# Patient Record
Sex: Female | Born: 1980
Health system: Southern US, Community
[De-identification: ages and names within clinical notes are randomized; demographics above are authoritative.]

## PROBLEM LIST (undated history)

## (undated) DIAGNOSIS — N63 Unspecified lump in unspecified breast: Secondary | ICD-10-CM

## (undated) DIAGNOSIS — F988 Other specified behavioral and emotional disorders with onset usually occurring in childhood and adolescence: Secondary | ICD-10-CM

## (undated) HISTORY — PX: OTHER SURGICAL HISTORY: SHX169

## (undated) HISTORY — PX: REPAIR ANKLE LIGAMENT: SUR1187

---

## 1999-05-19 ENCOUNTER — Other Ambulatory Visit: Admission: RE | Admit: 1999-05-19 | Discharge: 1999-05-19 | Payer: Self-pay

## 2000-05-18 ENCOUNTER — Other Ambulatory Visit: Admission: RE | Admit: 2000-05-18 | Discharge: 2000-05-18 | Payer: Self-pay | Admitting: Obstetrics & Gynecology

## 2001-07-05 ENCOUNTER — Other Ambulatory Visit: Admission: RE | Admit: 2001-07-05 | Discharge: 2001-07-05 | Payer: Self-pay | Admitting: Obstetrics & Gynecology

## 2002-08-02 ENCOUNTER — Other Ambulatory Visit: Admission: RE | Admit: 2002-08-02 | Discharge: 2002-08-02 | Payer: Self-pay | Admitting: Obstetrics & Gynecology

## 2003-06-02 ENCOUNTER — Inpatient Hospital Stay (HOSPITAL_COMMUNITY): Admission: AD | Admit: 2003-06-02 | Discharge: 2003-06-02 | Payer: Self-pay | Admitting: Obstetrics & Gynecology

## 2003-06-26 ENCOUNTER — Inpatient Hospital Stay (HOSPITAL_COMMUNITY): Admission: AD | Admit: 2003-06-26 | Discharge: 2003-06-29 | Payer: Self-pay | Admitting: Obstetrics & Gynecology

## 2003-06-30 ENCOUNTER — Encounter: Admission: RE | Admit: 2003-06-30 | Discharge: 2003-07-30 | Payer: Self-pay | Admitting: Obstetrics & Gynecology

## 2003-08-05 ENCOUNTER — Other Ambulatory Visit: Admission: RE | Admit: 2003-08-05 | Discharge: 2003-08-05 | Payer: Self-pay | Admitting: Obstetrics & Gynecology

## 2004-07-29 ENCOUNTER — Ambulatory Visit: Payer: Self-pay | Admitting: Family Medicine

## 2004-09-09 ENCOUNTER — Other Ambulatory Visit: Admission: RE | Admit: 2004-09-09 | Discharge: 2004-09-09 | Payer: Self-pay | Admitting: Obstetrics and Gynecology

## 2004-10-21 ENCOUNTER — Ambulatory Visit: Payer: Self-pay | Admitting: *Deleted

## 2004-10-28 ENCOUNTER — Ambulatory Visit: Payer: Self-pay | Admitting: *Deleted

## 2004-11-25 ENCOUNTER — Ambulatory Visit: Payer: Self-pay | Admitting: *Deleted

## 2005-02-03 ENCOUNTER — Other Ambulatory Visit: Admission: RE | Admit: 2005-02-03 | Discharge: 2005-02-03 | Payer: Self-pay | Admitting: Obstetrics and Gynecology

## 2005-04-09 ENCOUNTER — Ambulatory Visit: Payer: Self-pay | Admitting: Family Medicine

## 2005-05-21 ENCOUNTER — Ambulatory Visit: Payer: Self-pay | Admitting: Family Medicine

## 2006-01-31 ENCOUNTER — Encounter: Admission: RE | Admit: 2006-01-31 | Discharge: 2006-01-31 | Payer: Self-pay | Admitting: Obstetrics and Gynecology

## 2006-06-03 ENCOUNTER — Ambulatory Visit (HOSPITAL_COMMUNITY): Admission: RE | Admit: 2006-06-03 | Discharge: 2006-06-03 | Payer: Self-pay | Admitting: Obstetrics and Gynecology

## 2006-08-01 ENCOUNTER — Inpatient Hospital Stay (HOSPITAL_COMMUNITY): Admission: AD | Admit: 2006-08-01 | Discharge: 2006-08-01 | Payer: Self-pay | Admitting: Obstetrics and Gynecology

## 2006-11-08 ENCOUNTER — Ambulatory Visit (HOSPITAL_COMMUNITY): Admission: RE | Admit: 2006-11-08 | Discharge: 2006-11-09 | Payer: Self-pay | Admitting: Neurosurgery

## 2007-06-09 ENCOUNTER — Ambulatory Visit: Payer: Self-pay | Admitting: Cardiology

## 2007-08-31 ENCOUNTER — Ambulatory Visit: Payer: Self-pay | Admitting: Cardiology

## 2007-11-15 ENCOUNTER — Inpatient Hospital Stay (HOSPITAL_COMMUNITY): Admission: AD | Admit: 2007-11-15 | Discharge: 2007-11-15 | Payer: Self-pay | Admitting: Obstetrics and Gynecology

## 2007-12-19 ENCOUNTER — Inpatient Hospital Stay (HOSPITAL_COMMUNITY): Admission: AD | Admit: 2007-12-19 | Discharge: 2007-12-19 | Payer: Self-pay | Admitting: Obstetrics and Gynecology

## 2007-12-27 ENCOUNTER — Inpatient Hospital Stay (HOSPITAL_COMMUNITY): Admission: RE | Admit: 2007-12-27 | Discharge: 2007-12-29 | Payer: Self-pay | Admitting: Obstetrics and Gynecology

## 2008-01-23 ENCOUNTER — Ambulatory Visit: Admission: RE | Admit: 2008-01-23 | Discharge: 2008-01-23 | Payer: Self-pay | Admitting: Obstetrics and Gynecology

## 2009-02-11 ENCOUNTER — Encounter (INDEPENDENT_AMBULATORY_CARE_PROVIDER_SITE_OTHER): Payer: Self-pay | Admitting: *Deleted

## 2010-11-03 NOTE — Assessment & Plan Note (Signed)
[facility]                            CARDIOLOGY OFFICE NOTE   NAME:Oliveri, TYSHAY ADEE                     MRN:          [id number]  DATE:08/31/2007                            DOB:          29-Nov-1980    Ms. Buckwalter returns today for further management of her history of  premature atrial contractions, history of sinus tachycardia and PVCs  which were symptomatic.   She is now [redacted] weeks pregnant.  She has history of a diagnosis of mitral  valve prolapse, but her last 2D echocardiogram in 2005 showed no  significant prolapse.   We placed her on labetalol 100 mg p.r.n. for palpitations.  She has had  to take this a couple times a week and unfortunately it drops her blood  pressure and she gets lightheaded.  She has had her pressure drop as low  as 90.  She really would like to take atenolol.  I have again run this  by Dr. Shelby Dubin, who says this is a class D precaution in pregnancy  even third trimester.   Her blood pressure today is 122/70, her pulse is 96 and regular.  She  weighs 221.  The rest her exam is unchanged.   I had a long talk with Ms. Winther today.  We will try to go with 50 mg  of labetalol and if that does not work, she can take another 50 about 30  minutes later.  Hopefully this will attenuate any hypotension.  I have  also told her that if she gets lightheaded or if her blood pressure  drops, to lay down and lay on her left side.   She is going to deliver December 23, 2007, so hopefully this will tide her  over until that time.  Again, we do not feel safe using atenolol or  Toprol at this time.     Thomas C. Daleen Squibb, MD, [facility]  Electronically Signed    TCW/MedQ  DD: 08/31/2007  DT: 09/01/2007  Job #: [id number]   cc:   Elease Hashimoto A. Benedetto Goad, M.D.

## 2010-11-03 NOTE — Assessment & Plan Note (Signed)
[facility]                            CARDIOLOGY OFFICE NOTE   NAME:Joanne Fletcher, Joanne Fletcher ROUTE                     MRN:          [id number]  DATE:06/09/2007                            DOB:          [birth year]    I was asked by Dr. Benedetto Goad to evaluate Joanne Fletcher, a delightful 30-  year-old married white female who is about [redacted] weeks pregnant.   She has a history of tachy palpitations which were made worse with her  first pregnancy.  Even after pregnancy, she has continued to have them.  She has been evaluated by E. Graceann Congress, MD, in our office, in 2006.  A monitor demonstrated PACs, sinus tachycardia sometimes to a rate of  140 beats per minute and questionable premature ventricular contractions  or PACs with aberrancy.  They responded beautifully to Toprol XL 25 mg a  day.  She also was told to decrease her caffeine which also seemed to  help.   She has begun having palpitations again. They are spontaneous and  sometimes are very brief, sometimes can be more prolonged and more  aggravating.  She denies any syncope or presyncope.   There was a diagnosis of mitral valve prolapse in the past but a 2-D  echocardiogram  at [facility] dated January 29, 2004, showed no  prolapse and a normal study.   PAST MEDICAL HISTORY:  She takes prenatal vitamins one daily.  She is  intolerant to CODEINE, PERCOCET and MINOCYCLINE.  She is not sure if she  has ever had contrast dye.  She does not smoke, she does not drink or  use recreational products.  She does drink caffeine but has decreased it  dramatically.  She is not exercising regularly.   PAST SURGICAL HISTORY:  1. Reconstruction of her left ankle July 1999.  2. Reconstruction of a tendon from her leg in 2000/2001.  3. Partial discectomy May 2008.   FAMILY HISTORY:  Really negative for premature coronary disease or  sudden cardiac death.   CURRENT MEDICATION:  Prenatal vitamins.  She was taking  her Toprol up to  about the sixth or seventh week of pregnancy.  She is no longer taking  it.   SOCIAL HISTORY:  She is married.  She has one child.  She is a  Psychologist, clinical.  She seems to know quite a bit  about pharmacology.  It was rather pleasant speaking to her about all  these things.   REVIEW OF SYSTEMS:  Negative other than the HPI.   PHYSICAL EXAMINATION:  VITAL SIGNS:  Blood pressure 122/78, pulse 89.  During my listening to her, she did not have any extrasystoles or  premature beats.  Her EKG is normal except for some sinus arrhythmia.  Her PR, QRS and QTCs are normal.  There were no PACs or PVCs.  HEENT:  Normocephalic and atraumatic.  PERRLA.  Extraocular movements  intact.  Sclerae are clear.  Facial symmetry is normal.  NECK:  Supple.  Carotid upstrokes are equal bilaterally without bruits.  No JVD.  Thyroid is not palpable or enlarged.  LUNGS:  Clear.  CARDIOVASCULAR:  Normal S1 and S2. There is no gallop.  There is no  click.  ABDOMEN:  Soft, good bowel sounds, no midline bruit.  EXTREMITIES:  No edema.  Pulses intact.  NEUROLOGIC:  Intact.   ASSESSMENT:  Probable benign premature beats resulting in tachy  palpitations.  This seems to be worse with pregnancy.   I have had a long talk with the patient. She does have particularly bad  days.  I have talked to Dr. Shelby Dubin, our PharmD, and she has  recommended labetalol 100 mg to 200 mg p.r.n.  She can take this up to  twice a day.  Hopefully she will not require much of it.   We will plan on seeing her back after pregnancy if her palpitations  continue.  Otherwise, we will see her back on a p.r.n. basis.     Thomas C. Daleen Squibb, MD, [facility]  Electronically Signed    TCW/MedQ  DD: 06/09/2007  DT: 06/11/2007  Job #: [id number]

## 2010-11-03 NOTE — Op Note (Signed)
NAMELYNDI, HOLBEIN              ACCOUNT NO.:  [id number]   MEDICAL RECORD NO.:  [id number]          PATIENT TYPE:  OIB   LOCATION:  3172                         FACILITY:  [facility]   PHYSICIAN:  Payton Doughty, M.D.      DATE OF BIRTH:  1981/05/10   DATE OF PROCEDURE:  11/08/2006  DATE OF DISCHARGE:                               OPERATIVE REPORT   PREOPERATIVE DIAGNOSIS:  Herniated disc at L4-L5 on the right.   POSTOPERATIVE DIAGNOSIS:  Herniated disc at L4-L5 on the right.   OPERATIVE PROCEDURE:  L4-L5 laminectomy and discectomy.   ANESTHESIA:  General endotracheal anesthesia.   PREPARATION:  Betadine prep and alcohol wipe.   COMPLICATIONS:  None.   SURGEON:  Payton Doughty, M.D.   ASSISTANT:  Danae Orleans. Venetia Maxon, M.D.  Nurse assistant Basilia Jumbo.   BODY OF TEXT:  This s a 30 year old girl with a right L5 radiculopathy  and herniated disc at L4-L5 on the right.  She is taken to the operating  room, smoothly anesthetized, and intubated, placed prone on the  operating table. Following shave, prep, and drape in the usual sterile  fashion, the skin was infiltrated with lidocaine with 1:200,000  epinephrine.  The skin was incised over the L4 lamina.  Intraoperative x-  ray confirmed correctness of the level.  Having confirmed correctness of  the level, hemi-semilaminectomy of L4 was carried out with a high speed  drill and Kerrison up to the top of the ligamentum flavum which was  removed in retrograde fashion.  The lateral margin of the L5 root was  identified and gently retracted medially.  The annular fibers were  divided and herniated disc was extruded from the L4-L5 interspace.  This  resulted in marked relaxation of the L5 root on the right.  The disc  space was explored and all marginally clinging fragments were removed.  The neural foramen and the course of the L5 root were also explored and  felt to be free.  The wound was irrigated.  Hemostasis was assured.  Depo-Medrol soaked  fat was placed in the laminectomy defect.  Successive  layers of 0 Vicryl, 2-0 Vicryl, and 4-0 Vicryl were used to close.  Benzoin and Steri-Strips were placed and made occlusive with Telfa and  OpSite.  The patient returned to the recovery room in good condition.           ______________________________  Payton Doughty, M.D.     MWR/MEDQ  D:  11/08/2006  T:  11/08/2006  Job:  [id number]

## 2010-11-03 NOTE — H&P (Signed)
NAMECLEDA, Joanne Fletcher              ACCOUNT NO.:  [id number]   MEDICAL RECORD NO.:  [id number]          PATIENT TYPE:  OIB   LOCATION:  3172                         FACILITY:  [facility]   PHYSICIAN:  Payton Doughty, M.D.      DATE OF BIRTH:  [birth year]   DATE OF ADMISSION:  11/08/2006  DATE OF DISCHARGE:                              HISTORY & PHYSICAL   ADMISSION DIAGNOSIS:  Herniated disk on the right side at L4-5.   HISTORY OF PRESENT ILLNESS:  A very nice 30 year old right-handed white  girl in March was pulling some weeks and had pain in her back, down the  right buttock, into her right leg and out her toes.  MR showed a large  disk of L4-5 off to the right.  She underwent epidural steroids and that  did not help her enough.  She is admitted now for diskectomy.   MEDICAL HISTORY:  1. Remarkable for SVT for which she occasionally takes Toprol.  She      has been off it for a bit.  2. Two left ankle ligament reconstructions.  3. Tonsillectomy.   ALLERGIES:  CODEINE, OXYCODONE,   SOCIAL HISTORY:  She does not smoke or drink.  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   FAMILY HISTORY:  Mom is in good health.  Dad is 24 with diabetes and  hypertension.   REVIEW OF SYSTEMS:  Remarkable for SVT and leg pain.  HEENT:  Exam  normal limits.  Has good range of motion in her neck.  CHEST:  Clear.  CARDIAC:  Regular rate and rhythm.  ABDOMEN:  Nontender with no  hepatosplenomegaly.  EXTREMITIES:  No clubbing, cyanosis.  GU:  Deferred.  Peripheral pulses are good.  NEUROLOGICALLY:  She is awake,  alert and oriented, cranial nerves intact.  Motor exam shows 5/5  strength throughout the upper and lower extremities.  No current sensory  deficit.  Reflexes are one at the knees, 2 at the left ankle, one at the  right.  Straight leg raise and reverse straight leg raise are both  positive for severe right leg pain.  She comes in with a rather  temperamental disk that shows a disk at L4-5 off to  the right side.   CLINICAL IMPRESSION:  Right L5 radiculopathy related to herniated disk  at L4-5, having failed epidural steroids and other conservative  measures.  Plan is for lumbar laminectomy diskectomy.  The risks and  benefits of this approach have been discussed with her, and she wishes  to proceed.    .           ______________________________  Payton Doughty, M.D.    MWR/MEDQ  D:  11/08/2006  T:  11/08/2006  Job:  [id number]

## 2011-03-17 LAB — CBC
HCT: 35.8 — ABNORMAL LOW
Hemoglobin: 12.1
MCV: 86.3
RBC: 4.14

## 2011-03-18 LAB — CBC
HCT: 36.7
MCHC: 33.4
MCHC: 33.7
MCV: 84.3
Platelets: 300
RBC: 3.95
RBC: 4.35
RDW: 14.6
WBC: 13.9 — ABNORMAL HIGH

## 2011-03-18 LAB — RPR: RPR Ser Ql: NONREACTIVE

## 2012-03-28 ENCOUNTER — Ambulatory Visit: Payer: Self-pay | Admitting: Cardiology

## 2013-07-12 ENCOUNTER — Emergency Department (INDEPENDENT_AMBULATORY_CARE_PROVIDER_SITE_OTHER)
Admission: EM | Admit: 2013-07-12 | Discharge: 2013-07-12 | Discharge status: Against Medical Advice | Payer: 59 | Source: Home / Self Care

## 2013-07-12 ENCOUNTER — Encounter (HOSPITAL_COMMUNITY): Payer: Self-pay | Admitting: Emergency Medicine

## 2013-07-12 DIAGNOSIS — M79673 Pain in unspecified foot: Secondary | ICD-10-CM

## 2013-07-12 DIAGNOSIS — M79609 Pain in unspecified limb: Secondary | ICD-10-CM

## 2013-07-12 HISTORY — DX: Other specified behavioral and emotional disorders with onset usually occurring in childhood and adolescence: F98.8

## 2013-07-12 NOTE — ED Notes (Signed)
Patient has to go to work, notified david, [place], np that patient leaving.  Department flow interrupted by a high acuity patient.

## 2013-07-12 NOTE — ED Notes (Signed)
Has noted lateral foot pain if ambulating a lot or uneven surface.  Pain for a month, but recently pain has become more burning

## 2013-07-17 ENCOUNTER — Other Ambulatory Visit (HOSPITAL_COMMUNITY): Payer: Self-pay | Admitting: Orthopaedic Surgery

## 2013-07-17 DIAGNOSIS — M25579 Pain in unspecified ankle and joints of unspecified foot: Secondary | ICD-10-CM

## 2013-07-26 ENCOUNTER — Ambulatory Visit (HOSPITAL_COMMUNITY)
Admission: RE | Admit: 2013-07-26 | Discharge: 2013-07-26 | Disposition: A | Discharge status: Home / Self Care | Payer: 59 | Source: Ambulatory Visit | Attending: Orthopaedic Surgery | Admitting: Orthopaedic Surgery

## 2013-07-26 DIAGNOSIS — M25569 Pain in unspecified knee: Secondary | ICD-10-CM | POA: Insufficient documentation

## 2013-07-26 DIAGNOSIS — M25579 Pain in unspecified ankle and joints of unspecified foot: Secondary | ICD-10-CM

## 2013-10-13 ENCOUNTER — Ambulatory Visit (INDEPENDENT_AMBULATORY_CARE_PROVIDER_SITE_OTHER): Payer: 59 | Admitting: Family Medicine

## 2013-10-13 VITALS — BP 100/78 | HR 86 | Temp 98.4°F | Resp 16 | Ht 66.0 in | Wt 244.8 lb

## 2013-10-13 DIAGNOSIS — R509 Fever, unspecified: Secondary | ICD-10-CM

## 2013-10-13 DIAGNOSIS — J069 Acute upper respiratory infection, unspecified: Secondary | ICD-10-CM

## 2013-10-13 DIAGNOSIS — B9789 Other viral agents as the cause of diseases classified elsewhere: Secondary | ICD-10-CM

## 2013-10-13 DIAGNOSIS — R51 Headache: Secondary | ICD-10-CM

## 2013-10-13 DIAGNOSIS — B349 Viral infection, unspecified: Secondary | ICD-10-CM

## 2013-10-13 DIAGNOSIS — R05 Cough: Secondary | ICD-10-CM

## 2013-10-13 DIAGNOSIS — R059 Cough, unspecified: Secondary | ICD-10-CM

## 2013-10-13 MED ORDER — BENZONATATE 100 MG PO CAPS: 100.0000 mg | ORAL_CAPSULE | Freq: Three times a day (TID) | ORAL | Status: DC | PRN

## 2013-10-13 MED ORDER — FLUTICASONE PROPIONATE 50 MCG/ACT NA SUSP: 2.0000 | Freq: Every day | NASAL | Status: DC

## 2013-10-13 NOTE — Progress Notes (Signed)
Subjective: Patient has been sick since yesterday morning. She had a temperature of 103 yesterday. She has had head congestion and pressure. Her ears feel stuffy. Her throat is sore. She's been coughing. No one else at home is sick. 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  Objective: Pleasant lady in no major distress though she is ill-looking her TMs are normal. Throat mildly erythematous. She's had her tonsils out with a little asymmetrical scarring of the back of the throat. Her neck is supple with one posterior cervical node on the left. Her chest is clear to auscultation. Heart regular without murmurs gallops or arrhythmias. No wheezing.  Assessment: Viral syndrome with cough, headache, ear stuffiness and eustachian tube dysfunction  Plan: Fluticasone Cough pills Fluids and rest Off work today and tomorrow

## 2013-10-13 NOTE — Patient Instructions (Signed)
Tylenol or ibuprofen for fever, headache, and achiness.  Fluticasone nose spray 2 sprays each nostril twice daily for 3 days then drop back to once daily  Tessalon ( benzonatate) if needed for coughing  Use some Mucinex helps thin the secretions  Can continue the cough and cold preparations if needed or  If you're getting worse, persistent high fevers, shortness of breath, or other concern return or give  a call.

## 2013-12-07 ENCOUNTER — Ambulatory Visit: Payer: 59 | Admitting: Family Medicine

## 2014-03-21 ENCOUNTER — Ambulatory Visit: Payer: 59 | Admitting: Dietician

## 2015-01-05 ENCOUNTER — Ambulatory Visit (INDEPENDENT_AMBULATORY_CARE_PROVIDER_SITE_OTHER): Payer: 59 | Admitting: Family Medicine

## 2015-01-05 VITALS — BP 136/84 | HR 106 | Temp 98.4°F | Resp 18 | Ht 66.0 in | Wt 254.0 lb

## 2015-01-05 DIAGNOSIS — R21 Rash and other nonspecific skin eruption: Secondary | ICD-10-CM | POA: Diagnosis not present

## 2015-01-05 LAB — POCT CBC
Granulocyte percent: 53.8 %G (ref 37–80)
HCT, POC: 43.4 % (ref 37.7–47.9)
Hemoglobin: 14.8 g/dL (ref 12.2–16.2)
Lymph, poc: 3 (ref 0.6–3.4)
MCH, POC: 28.6 pg (ref 27–31.2)
MCHC: 34.1 g/dL (ref 31.8–35.4)
MCV: 84 fL (ref 80–97)
MID (cbc): 0.4 (ref 0–0.9)
MPV: 7.7 fL (ref 0–99.8)
POC Granulocyte: 4 (ref 2–6.9)
POC LYMPH PERCENT: 40.2 %L (ref 10–50)
POC MID %: 6 %M (ref 0–12)
Platelet Count, POC: 328 10*3/uL (ref 142–424)
RBC: 5.17 M/uL (ref 4.04–5.48)
RDW, POC: 13.4 %
WBC: 7.4 10*3/uL (ref 4.6–10.2)

## 2015-01-05 LAB — POCT SEDIMENTATION RATE: POCT SED RATE: 13 mm/hr (ref 0–22)

## 2015-01-05 MED ORDER — FLUOCINONIDE-E 0.05 % EX CREA: 1.0000 "application " | TOPICAL_CREAM | Freq: Two times a day (BID) | CUTANEOUS | Status: DC

## 2015-01-05 NOTE — Progress Notes (Signed)
Patient ID: Joanne Fletcher, female   DOB: 12/03/1980, 34 y.o.   MRN: [id number]   This chart was scribed for Elvina SidleKurt Lauenstein, MD by [facility], medical scribe at [facility].The patient was seen in exam room 11 and the patient's care was started at 1:29 PM.  Patient ID: Joanne Fletcher MRN: [id number], DOB: 01/04/1981, 34 y.o. Date of Encounter: 01/05/2015  Primary Physician: No PCP Per Patient  Chief Complaint:  Chief Complaint  Patient presents with   Hand Injury    swelling, itch, around a kid who recently had hand/foot/mouth   HPI:  Joanne Fletcher is a 34 y.o. female who presents to [facility] complaining of pain, itching, and soreness on the palm of her hands, mouth and in between her toes. The areas are hypersensitive and woke her up last night. Symptoms began yesterday. She also complains of a slight headache but no fever. A little boy she was around did have hand/foot/mouth. She has not been a hiking and did not find a tick on her. 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 She denies fever and cough.  Past Medical History  Diagnosis Date   ADD (attention deficit disorder)     Home Meds: Prior to Admission medications   Medication Sig Start Date End Date Taking? Authorizing Provider  atomoxetine (STRATTERA) 40 MG capsule Take 40 mg by mouth daily.    Historical Provider, MD   Allergies:  Allergies  Allergen Reactions   Codeine    History   Social History   Marital Status: Married    Spouse Name: N/A   Number of Children: N/A   Years of Education: N/A   Occupational History   Not on file.   Social History Main Topics   Smoking status: Never Smoker    Smokeless tobacco: Not on file   Alcohol Use: No   Drug Use: No   Sexual Activity: Not on file   Other Topics Concern   Not on file   Social History Narrative    Review of Systems: Constitutional: negative for chills, fever, night sweats,  weight changes, or fatigue  HEENT: negative for vision changes, hearing loss, congestion, rhinorrhea, ST, epistaxis, or sinus pressure Cardiovascular: negative for chest pain or palpitations Respiratory: negative for hemoptysis, wheezing, shortness of breath, or cough Abdominal: negative for abdominal pain, nausea, vomiting, diarrhea, or constipation Dermatological: positive for rash Neurologic: negative for dizziness, or syncope. Positive for headache. All other systems reviewed and are otherwise negative with the exception to those above and in the HPI.  Physical Exam: Blood pressure 136/84, pulse 106, temperature 98.4 F (36.9 C), resp. rate 18, height 5\' 6"  (1.676 m), weight 254 lb (115.214 kg), last menstrual period 01/04/2015, SpO2 99 %., Body mass index is 41.02 kg/(m^2). General: Well developed, well nourished, in no acute distress. Head: Normocephalic, atraumatic, eyes without discharge, sclera non-icteric, nares are without discharge. Bilateral auditory canals clear, TM's are without perforation, pearly grey and translucent with reflective cone of light bilaterally. Oral cavity moist, posterior pharynx without exudate, erythema, peritonsillar abscess, or post nasal drip.  Neck: Supple. No thyromegaly. Full ROM. No lymphadenopathy. Lungs: Clear bilaterally to auscultation without wheezes, rales, or rhonchi. Breathing is unlabored. Heart: RRR with S1 S2. No murmurs, rubs, or gallops appreciated. Abdomen: Soft, non-tender, non-distended with normoactive bowel sounds. No hepatomegaly. No rebound/guarding. No obvious abdominal masses. Msk:  Strength and tone normal for age. Extremities/Skin: Warm and  dry. No clubbing or cyanosis. No edema. No rashes or suspicious lesions. Neuro: Alert and oriented X 3. Moves all extremities spontaneously. Gait is normal. CNII-XII grossly in tact. Psych:  Responds to questions appropriately with a normal affect.   Labs: Results for orders placed or  performed in visit on 01/05/15  POCT CBC  Result Value Ref Range   WBC 7.4 4.6 - 10.2 K/uL   Lymph, poc 3.0 0.6 - 3.4   POC LYMPH PERCENT 40.2 10 - 50 %L   MID (cbc) 0.4 0 - 0.9   POC MID % 6.0 0 - 12 %M   POC Granulocyte 4.0 2 - 6.9   Granulocyte percent 53.8 37 - 80 %G   RBC 5.17 4.04 - 5.48 M/uL   Hemoglobin 14.8 12.2 - 16.2 g/dL   HCT, POC 09.8 11.9 - 47.9 %   MCV 84.0 80 - 97 fL   MCH, POC 28.6 27 - 31.2 pg   MCHC 34.1 31.8 - 35.4 g/dL   RDW, POC 14.7 %   Platelet Count, POC 328 142 - 424 K/uL   MPV 7.7 0 - 99.8 fL    ASSESSMENT AND PLAN:  34 y.o. year old female with new onset of hand rash which is most typical of an allergic reaction. There is no skin exfoliation of the fingertips or lips. This chart was scribed in my presence and reviewed by me personally.    ICD-9-CM ICD-10-CM   1. Rash of hands 782.1 R21 POCT SEDIMENTATION RATE     POCT CBC     fluocinonide-emollient (LIDEX-E) 0.05 % cream    Signed, Elvina Sidle, MD 01/05/2015 1:29 PM

## 2015-02-15 ENCOUNTER — Telehealth: Payer: 59 | Admitting: Family

## 2015-02-15 DIAGNOSIS — N390 Urinary tract infection, site not specified: Secondary | ICD-10-CM

## 2015-02-15 MED ORDER — SULFAMETHOXAZOLE-TRIMETHOPRIM 800-160 MG PO TABS: 1.0000 | ORAL_TABLET | Freq: Two times a day (BID) | ORAL | Status: DC

## 2015-02-15 NOTE — Progress Notes (Signed)

## 2015-04-30 ENCOUNTER — Ambulatory Visit (INDEPENDENT_AMBULATORY_CARE_PROVIDER_SITE_OTHER): Payer: 59 | Admitting: Family Medicine

## 2015-04-30 VITALS — BP 136/98 | HR 75 | Temp 97.8°F | Resp 16 | Ht 66.0 in | Wt 249.0 lb

## 2015-04-30 DIAGNOSIS — R591 Generalized enlarged lymph nodes: Secondary | ICD-10-CM

## 2015-04-30 DIAGNOSIS — S0001XA Abrasion of scalp, initial encounter: Secondary | ICD-10-CM

## 2015-04-30 DIAGNOSIS — R599 Enlarged lymph nodes, unspecified: Secondary | ICD-10-CM | POA: Diagnosis not present

## 2015-04-30 DIAGNOSIS — L089 Local infection of the skin and subcutaneous tissue, unspecified: Secondary | ICD-10-CM | POA: Diagnosis not present

## 2015-04-30 MED ORDER — DOXYCYCLINE HYCLATE 100 MG PO TABS: 100.0000 mg | ORAL_TABLET | Freq: Two times a day (BID) | ORAL | Status: DC

## 2015-04-30 NOTE — Progress Notes (Signed)
Subjective:  This chart was scribed for [name], MD by Stann Oresung-Kai Tsai, Medical Scribe. This patient was seen in room 2 and the patient's care was started 9:00 AM.   Patient ID: Joanne Fletcher, female    DOB: 06/05/1981, [age] y.o.   MRN: [id number]  HPI Joanne Fletcher is a 34 y.o. female Pt complains of swollen lymph nodes in her neck. She went to a new place to get her hair done 5 days ago. She noticed burning sensation and bumps on her scalp right after. She scratched the bumps and they have some clear, red discharge. She informs that the bumps spread towards back of her ear. She also has some a cough. She denies sore throat, fever, fatigue, ear pain.  She usually notices swollen lymph nodes.   She works in Administrator, [employer] at [employer], as Associate Professorpharmacy tech.   There are no active problems to display for this patient.  Past Medical History  Diagnosis Date  . ADD (attention deficit disorder)    Past Surgical History  Procedure Laterality Date  . Repair ankle ligament    . Partial discectomy     Allergies  Allergen Reactions  . Codeine    Prior to Admission medications   Medication Sig Start Date End Date Taking? Authorizing Provider  amphetamine-dextroamphetamine (ADDERALL XR) 20 MG 24 hr capsule Take 20 mg by mouth 2 (two) times daily.   Yes Historical Provider, MD   Social History   Social History  . Marital Status: Married    Spouse Name: N/A  . Number of Children: N/A  . Years of Education: N/A   Occupational History  . Not on file.   Social History Main Topics  . Smoking status: Never Smoker   . Smokeless tobacco: Not on file  . Alcohol Use: No  . Drug Use: No  . Sexual Activity: Not on file   Other Topics Concern  . Not on file   Social History Narrative    Review of Systems  Constitutional: Negative for fever and fatigue.  HENT: Negative for ear pain, rhinorrhea and sore throat.   Respiratory: Positive for cough.   Skin: Positive for rash (scalp).  Negative for wound.  Hematological: Positive for adenopathy.       Objective:   Physical Exam  Constitutional: She is oriented to person, place, and time. She appears well-developed and well-nourished. No distress.  HENT:  Head: Normocephalic and atraumatic.  Right Ear: Tympanic membrane normal.  Left Ear: Tympanic membrane normal.  Mouth/Throat: Oropharynx is clear and moist. No oropharyngeal exudate.  No appreciable exudate  Eyes: EOM are normal. Pupils are equal, round, and reactive to light.  Neck: Neck supple.  Cardiovascular: Normal rate.   Pulmonary/Chest: Effort normal. No respiratory distress.  Musculoskeletal: Normal range of motion.  Lymphadenopathy:       Head (right side): No tonsillar adenopathy present.       Head (left side): No tonsillar adenopathy present.  Enlarged tender node inferior to right ear, no submental anterior lymph nodes, small auricular node tender, tender occipital node on right; don't feel any on left.   Neurological: She is alert and oriented to person, place, and time.  Skin: Skin is warm and dry.  1 small patch of excoriation on right parietal scalp, no active discharge, but has erythema 1-2 cm; another small patch of erythema, occipital scalp, midline; another abraded area on occipital scalp  Psychiatric: She has a normal mood and affect. Her behavior is  normal.  Nursing note and vitals reviewed.   Filed Vitals:   04/30/15 0833  BP: 136/98  Pulse: 75  Temp: 97.8 F (36.6 C)  TempSrc: Oral  Resp: 16  Height:  (1.676 m)  Weight: 249 lb (112.946 kg)  SpO2: 98%       Assessment & Plan:   Joanne Fletcher is a 34 y.o. female Lymphadenopathy of head and neck - Plan: doxycycline (VIBRA-TABS) 100 MG tablet  Abrasion, scalp with infection, initial encounter - Plan: doxycycline (VIBRA-TABS) 100 MG tablet   suspected initial abrasion, secondary infection of scalp with reactive lymphadenopathy. Start doxycycline 100 mg twice a day.  Side effects discussed. RTC precautions. Would avoid use of same product  On the scalp in the future.   Meds ordered this encounter  Medications  . amphetamine-dextroamphetamine (ADDERALL XR) 20 MG 24 hr capsule    Sig: Take 20 mg by mouth 2 (two) times daily.  Marland Kitchen doxycycline (VIBRA-TABS) 100 MG tablet    Sig: Take 1 tablet (100 mg total) by mouth 2 (two) times daily.    Dispense:  20 tablet    Refill:  0   Patient Instructions   I suspect you had initial abrasion or contact dermatitis on the scalp, with secondary infection. Start doxycycline 1 pill twice a day. If any increased soreness, redness, or spread of the lesions in your scalp, or any fevers or worsening symptoms, return for recheck.   Return to the clinic or go to the nearest emergency room if any of your symptoms worsen or new symptoms occur.  Lymphadenopathy Lymphadenopathy refers to swollen or enlarged lymph glands, also called lymph nodes. Lymph glands are part of your body's defense (immune) system, which protects the body from infections, germs, and diseases. Lymph glands are found in many locations in your body, including the neck, underarm, and groin.  Many things can cause lymph glands to become enlarged. When your immune system responds to germs, such as viruses or bacteria, infection-fighting cells and fluid build up. This causes the glands to grow in size. Usually, this is not something to worry about. The swelling and any soreness often go away without treatment. However, swollen lymph glands can also be caused by a number of diseases. Your health care provider may do various tests to help determine the cause. If the cause of your swollen lymph glands cannot be found, it is important to monitor your condition to make sure the swelling goes away. HOME CARE INSTRUCTIONS Watch your condition for any changes. The following actions may help to lessen any discomfort you are feeling:  Get plenty of rest.  Take medicines only  as directed by your health care provider. Your health care provider may recommend over-the-counter medicines for pain.  Apply moist heat compresses to the site of swollen lymph nodes as directed by your health care provider. This can help reduce any pain.  Check your lymph nodes daily for any changes.  Keep all follow-up visits as directed by your health care provider. This is important. SEEK MEDICAL CARE IF:  Your lymph nodes are still swollen after 2 weeks.  Your swelling increases or spreads to other areas.  Your lymph nodes are hard, seem fixed to the skin, or are growing rapidly.  Your skin over the lymph nodes is red and inflamed.  You have a fever.  You have chills.  You have fatigue.  You develop a sore throat.  You have abdominal pain.  You have weight loss.  You have night sweats. SEEK IMMEDIATE MEDICAL CARE IF:  You notice fluid leaking from the area of the enlarged lymph node.  You have severe pain in any area of your body.  You have chest pain.  You have shortness of breath.   This information is not intended to replace advice given to you by your health care provider. Make sure you discuss any questions you have with your health care provider.   Document Released: 03/16/2008 Document Revised: 06/28/2014 Document Reviewed: 01/10/2014 Elsevier Interactive Patient Education [employer]       By signing my name below, I, Stann Ore, attest that this documentation has been prepared under the direction and in the presence of Meredith Staggers, MD. Electronically Signed: Stann Ore, Scribe. 04/30/2015 , 9:00 AM .

## 2015-04-30 NOTE — Patient Instructions (Signed)
I suspect you had initial abrasion or contact dermatitis on the scalp, with secondary infection. Start doxycycline 1 pill twice a day. If any increased soreness, redness, or spread of the lesions in your scalp, or any fevers or worsening symptoms, return for recheck.   Return to the clinic or go to the nearest emergency room if any of your symptoms worsen or new symptoms occur.  Lymphadenopathy Lymphadenopathy refers to swollen or enlarged lymph glands, also called lymph nodes. Lymph glands are part of your body's defense (immune) system, which protects the body from infections, germs, and diseases. Lymph glands are found in many locations in your body, including the neck, underarm, and groin.  Many things can cause lymph glands to become enlarged. When your immune system responds to germs, such as viruses or bacteria, infection-fighting cells and fluid build up. This causes the glands to grow in size. Usually, this is not something to worry about. The swelling and any soreness often go away without treatment. However, swollen lymph glands can also be caused by a number of diseases. Your health care provider may do various tests to help determine the cause. If the cause of your swollen lymph glands cannot be found, it is important to monitor your condition to make sure the swelling goes away. HOME CARE INSTRUCTIONS Watch your condition for any changes. The following actions may help to lessen any discomfort you are feeling:  Get plenty of rest.  Take medicines only as directed by your health care provider. Your health care provider may recommend over-the-counter medicines for pain.  Apply moist heat compresses to the site of swollen lymph nodes as directed by your health care provider. This can help reduce any pain.  Check your lymph nodes daily for any changes.  Keep all follow-up visits as directed by your health care provider. This is important. SEEK MEDICAL CARE IF:  Your lymph nodes are  still swollen after 2 weeks.  Your swelling increases or spreads to other areas.  Your lymph nodes are hard, seem fixed to the skin, or are growing rapidly.  Your skin over the lymph nodes is red and inflamed.  You have a fever.  You have chills.  You have fatigue.  You develop a sore throat.  You have abdominal pain.  You have weight loss.  You have night sweats. SEEK IMMEDIATE MEDICAL CARE IF:  You notice fluid leaking from the area of the enlarged lymph node.  You have severe pain in any area of your body.  You have chest pain.  You have shortness of breath.   This information is not intended to replace advice given to you by your health care provider. Make sure you discuss any questions you have with your health care provider.   Document Released: 03/16/2008 Document Revised: 06/28/2014 Document Reviewed: 01/10/2014 Elsevier Interactive Patient Education [employer] Elsevier Inc[employer]

## 2015-06-01 ENCOUNTER — Ambulatory Visit (INDEPENDENT_AMBULATORY_CARE_PROVIDER_SITE_OTHER): Payer: 59 | Admitting: Urgent Care

## 2015-06-01 VITALS — BP 110/64 | HR 85 | Temp 98.6°F | Resp 16 | Ht 66.0 in | Wt 247.0 lb

## 2015-06-01 DIAGNOSIS — J069 Acute upper respiratory infection, unspecified: Secondary | ICD-10-CM | POA: Diagnosis not present

## 2015-06-01 DIAGNOSIS — R059 Cough, unspecified: Secondary | ICD-10-CM

## 2015-06-01 DIAGNOSIS — R509 Fever, unspecified: Secondary | ICD-10-CM

## 2015-06-01 DIAGNOSIS — R05 Cough: Secondary | ICD-10-CM | POA: Diagnosis not present

## 2015-06-01 MED ORDER — BENZONATATE 100 MG PO CAPS: 100.0000 mg | ORAL_CAPSULE | Freq: Three times a day (TID) | ORAL | Status: DC | PRN

## 2015-06-01 MED ORDER — PSEUDOEPHEDRINE HCL ER 120 MG PO TB12: 120.0000 mg | ORAL_TABLET | Freq: Two times a day (BID) | ORAL | Status: DC

## 2015-06-01 NOTE — Patient Instructions (Signed)
Upper Respiratory Infection, Adult Most upper respiratory infections (URIs) are a viral infection of the air passages leading to the lungs. A URI affects the nose, throat, and upper air passages. The most common type of URI is nasopharyngitis and is typically referred to as "the common cold." URIs run their course and usually go away on their own. Most of the time, a URI does not require medical attention, but sometimes a bacterial infection in the upper airways can follow a viral infection. This is called a secondary infection. Sinus and middle ear infections are common types of secondary upper respiratory infections. Bacterial pneumonia can also complicate a URI. A URI can worsen asthma and chronic obstructive pulmonary disease (COPD). Sometimes, these complications can require emergency medical care and may be life threatening.  CAUSES Almost all URIs are caused by viruses. A virus is a type of germ and can spread from one person to another.  RISKS FACTORS You may be at risk for a URI if:   You smoke.   You have chronic heart or lung disease.  You have a weakened defense (immune) system.   You are very young or very old.   You have nasal allergies or asthma.  You work in crowded or poorly ventilated areas.  You work in health care facilities or schools. SIGNS AND SYMPTOMS  Symptoms typically develop 2-3 days after you come in contact with a cold virus. Most viral URIs last 7-10 days. However, viral URIs from the influenza virus (flu virus) can last 14-18 days and are typically more severe. Symptoms may include:   Runny or stuffy (congested) nose.   Sneezing.   Cough.   Sore throat.   Headache.   Fatigue.   Fever.   Loss of appetite.   Pain in your forehead, behind your eyes, and over your cheekbones (sinus pain).  Muscle aches.  DIAGNOSIS  Your health care provider may diagnose a URI by:  Physical exam.  Tests to check that your symptoms are not due to  another condition such as:  Strep throat.  Sinusitis.  Pneumonia.  Asthma. TREATMENT  A URI goes away on its own with time. It cannot be cured with medicines, but medicines may be prescribed or recommended to relieve symptoms. Medicines may help:  Reduce your fever.  Reduce your cough.  Relieve nasal congestion. HOME CARE INSTRUCTIONS   Take medicines only as directed by your health care provider.   Gargle warm saltwater or take cough drops to comfort your throat as directed by your health care provider.  Use a warm mist humidifier or inhale steam from a shower to increase air moisture. This may make it easier to breathe.  Drink enough fluid to keep your urine clear or pale yellow.   Eat soups and other clear broths and maintain good nutrition.   Rest as needed.   Return to work when your temperature has returned to normal or as your health care provider advises. You may need to stay home longer to avoid infecting others. You can also use a face mask and careful hand washing to prevent spread of the virus.  Increase the usage of your inhaler if you have asthma.   Do not use any tobacco products, including cigarettes, chewing tobacco, or electronic cigarettes. If you need help quitting, ask your health care provider. PREVENTION  The best way to protect yourself from getting a cold is to practice good hygiene.   Avoid oral or hand contact with people with cold   symptoms.   Wash your hands often if contact occurs.  There is no clear evidence that vitamin C, vitamin E, echinacea, or exercise reduces the chance of developing a cold. However, it is always recommended to get plenty of rest, exercise, and practice good nutrition.  SEEK MEDICAL CARE IF:   You are getting worse rather than better.   Your symptoms are not controlled by medicine.   You have chills.  You have worsening shortness of breath.  You have brown or red mucus.  You have yellow or brown nasal  discharge.  You have pain in your face, especially when you bend forward.  You have a fever.  You have swollen neck glands.  You have pain while swallowing.  You have white areas in the back of your throat. SEEK IMMEDIATE MEDICAL CARE IF:   You have severe or persistent:  Headache.  Ear pain.  Sinus pain.  Chest pain.  You have chronic lung disease and any of the following:  Wheezing.  Prolonged cough.  Coughing up blood.  A change in your usual mucus.  You have a stiff neck.  You have changes in your:  Vision.  Hearing.  Thinking.  Mood. MAKE SURE YOU:   Understand these instructions.  Will watch your condition.  Will get help right away if you are not doing well or get worse.   This information is not intended to replace advice given to you by your health care provider. Make sure you discuss any questions you have with your health care provider.   Document Released: 12/01/2000 Document Revised: 10/22/2014 Document Reviewed: 09/12/2013 Elsevier Interactive Patient Education 2016 Elsevier Inc.  

## 2015-06-01 NOTE — Progress Notes (Signed)
    MRN: [id number] DOB: 01/19/1981  Subjective:   Joanne Fletcher is a 34 y.o. female presenting for chief complaint of Fever; Cough; and Breathing Problem  Reports 1 day history of body aches, myalgia, fever (as high as 101F), night sweat, productive cough but no hemoptysis, pleuritic pain, some chest pain with a deep cough, some wheezing, bilateral ear pressure, runny nose. Has tried ibuprofen and NyQuil with some relief. Has 1 sick contact with an ear infection. Denies ear pain, ear drainage, sinus pain, itchy or red eyes, n/v, abdominal pain. Admits childhood asthma, denies seasonal allergies.   Joanne Fletcher has a current medication list which includes the following prescription(s): amphetamine-dextroamphetamine. Also is allergic to codeine.  Joanne Fletcher  has a past medical history of ADD (attention deficit disorder). Also  has past surgical history that includes Repair ankle ligament and partial discectomy.  Objective:   Vitals: BP 110/64 mmHg  Pulse 85  Temp(Src) 98.6 F (37 C) (Oral)  Resp 16  Ht 5\' 6"  (1.676 m)  Wt 247 lb (112.038 kg)  BMI 39.89 kg/m2  SpO2 98%  Physical Exam  Constitutional: She is oriented to person, place, and time. She appears well-developed and well-nourished.  HENT:  TM's intact bilaterally, no effusions or erythema. Nasal turbinates pink and moist, nasal passages patent. No sinus tenderness. Oropharynx with slight erythema and post-nasal drainage, tonsils absent. No exudates.  Eyes: Right eye exhibits no discharge. Left eye exhibits no discharge. No scleral icterus.  Neck: Normal range of motion. Neck supple.  Cardiovascular: Normal rate, regular rhythm and intact distal pulses.  Exam reveals no gallop and no friction rub.   No murmur heard. Pulmonary/Chest: No respiratory distress. She has no wheezes. She has no rales.  Lymphadenopathy:    She has cervical adenopathy (bilateral, anterior).  Neurological: She is alert and oriented to person, place, and  time.  Skin: Skin is warm and dry. No rash noted. No erythema. No pallor.   Assessment and Plan :   1. Upper respiratory infection 2. Cough 3. Fever, unspecified fever cause - Patient declined strep test and cxr. Advised supportive care for what is likely a viral syndrome. RTC in 1 week if no improvement.  Wallis BambergMario Kadey Mihalic, PA-C [facility] [phone] 06/01/2015 1:36 PM

## 2019-09-14 ENCOUNTER — Other Ambulatory Visit: Payer: Self-pay | Admitting: Obstetrics and Gynecology

## 2019-09-14 DIAGNOSIS — N6320 Unspecified lump in the left breast, unspecified quadrant: Secondary | ICD-10-CM

## 2019-09-17 ENCOUNTER — Ambulatory Visit
Admission: RE | Admit: 2019-09-17 | Discharge: 2019-09-17 | Disposition: A | Discharge status: Home / Self Care | Payer: BC Managed Care – PPO | Source: Ambulatory Visit | Attending: Obstetrics and Gynecology | Admitting: Obstetrics and Gynecology

## 2019-09-17 ENCOUNTER — Other Ambulatory Visit: Payer: Self-pay | Admitting: Obstetrics and Gynecology

## 2019-09-17 ENCOUNTER — Other Ambulatory Visit: Payer: Self-pay

## 2019-09-17 ENCOUNTER — Ambulatory Visit
Admission: RE | Admit: 2019-09-17 | Discharge: 2019-09-17 | Disposition: A | Discharge status: Home / Self Care | Payer: 59 | Source: Ambulatory Visit | Attending: Obstetrics and Gynecology | Admitting: Obstetrics and Gynecology

## 2019-09-17 DIAGNOSIS — N6320 Unspecified lump in the left breast, unspecified quadrant: Secondary | ICD-10-CM

## 2019-09-27 ENCOUNTER — Ambulatory Visit
Admission: RE | Admit: 2019-09-27 | Discharge: 2019-09-27 | Disposition: A | Discharge status: Home / Self Care | Payer: BC Managed Care – PPO | Source: Ambulatory Visit | Attending: Obstetrics and Gynecology | Admitting: Obstetrics and Gynecology

## 2019-09-27 ENCOUNTER — Other Ambulatory Visit: Payer: Self-pay

## 2019-09-27 DIAGNOSIS — N6320 Unspecified lump in the left breast, unspecified quadrant: Secondary | ICD-10-CM

## 2021-01-20 ENCOUNTER — Other Ambulatory Visit: Payer: Self-pay | Admitting: Obstetrics and Gynecology

## 2021-01-20 DIAGNOSIS — Z1231 Encounter for screening mammogram for malignant neoplasm of breast: Secondary | ICD-10-CM

## 2021-02-24 ENCOUNTER — Telehealth: Payer: Self-pay

## 2021-02-24 NOTE — Telephone Encounter (Signed)
Referral notes received from [facility], Phone #: [phone], Fax #: [phone]   A copy of the referral have been placed in the scheduling box for check-out to pick-up and to enter referral. Original notes placed in file cabinet.

## 2021-03-09 ENCOUNTER — Other Ambulatory Visit: Payer: Self-pay

## 2021-03-09 ENCOUNTER — Ambulatory Visit: Payer: BC Managed Care – PPO | Admitting: Cardiology

## 2021-03-09 ENCOUNTER — Encounter: Payer: Self-pay | Admitting: Cardiology

## 2021-03-09 VITALS — BP 142/89 | HR 111 | Temp 97.5°F | Resp 16 | Ht 66.0 in | Wt 273.6 lb

## 2021-03-09 DIAGNOSIS — R002 Palpitations: Secondary | ICD-10-CM

## 2021-03-09 DIAGNOSIS — I493 Ventricular premature depolarization: Secondary | ICD-10-CM

## 2021-03-09 DIAGNOSIS — R0789 Other chest pain: Secondary | ICD-10-CM

## 2021-03-09 DIAGNOSIS — R0609 Other forms of dyspnea: Secondary | ICD-10-CM

## 2021-03-09 DIAGNOSIS — R0600 Dyspnea, unspecified: Secondary | ICD-10-CM

## 2021-03-09 DIAGNOSIS — R030 Elevated blood-pressure reading, without diagnosis of hypertension: Secondary | ICD-10-CM

## 2021-03-09 NOTE — Progress Notes (Signed)
Primary Physician/Referring:  Charlynn Court, NP  Patient ID: Joanne Fletcher, female    DOB: 08-May-1981, 40 y.o.   MRN: [id number]  Chief Complaint  Patient presents with   New Patient (Initial Visit)    Referred by Lucita Lora, NP   Chest Pain   HPI:    Joanne Fletcher  is a 40 y.o. Caucasian female patient referred to me for evaluation of palpitations and chest pain.  She has chronic palpitations starting in her teenage years in the form of rapid heartbeats lasting a few seconds but recently has been having frequent episodes of flip-flops and skipped beats.  Over the past 1 to 2 months she has also noticed chest tightness with exertion activity and sometimes at rest that last for 1 to 2 minutes but sometimes is exacerbated by exercise and can last longer.  She has also noticed dyspnea on exertion.  Also admits to having gained weight over the past few months after she gave up on a diet plan that she was on.  Chest pain is described as tightness in the middle of the chest, occurs when she lays down on the left side, also occurs when she is doing routine activities when last for a few seconds to a minute or 2 but sometimes she is also noticed chest tightness with exertional activity.  She has also noticed worsening shortness of breath over the last few months.  No PND or orthopnea.  Past Medical History:  Diagnosis Date   ADD (attention deficit disorder)    Past Surgical History:  Procedure Laterality Date   partial discectomy     REPAIR ANKLE LIGAMENT     Family History  Problem Relation Age of Onset   Depression Mother    Osteoarthritis Mother    Fibromyalgia Mother    Heart attack Father 57       2012   Diabetes Father    Heart disease Father    Diabetes Sister    Hyperlipidemia Sister    Heart attack Paternal Grandfather     Social History   Tobacco Use   Smoking status: Never   Smokeless tobacco: Never  Substance Use Topics   Alcohol use: Yes    Comment:  occasionally   Marital Status: Married  ROS  Review of Systems  Constitutional: Positive for weight gain.  Cardiovascular:  Positive for chest pain, dyspnea on exertion, palpitations and paroxysmal nocturnal dyspnea. Negative for syncope.  Gastrointestinal: Negative.   Objective  Blood pressure (!) 142/89, pulse (!) 111, temperature (!) 97.5 F (36.4 C), temperature source Temporal, resp. rate 16, height 5' 6"  (1.676 m), weight 273 lb 9.6 oz (124.1 kg), SpO2 97 %. Body mass index is 44.16 kg/m.  Vitals with BMI 03/09/2021 06/01/2015 04/30/2015  Height 5' 6"  5' 6"  5' 6"   Weight 273 lbs 10 oz 247 lbs 249 lbs  BMI 72.09 40 47.0  Systolic 962 836 629  Diastolic 89 64 98  Pulse 476 85 75     Physical Exam Constitutional:      Appearance: She is morbidly obese.  Neck:     Vascular: No carotid bruit or JVD.  Cardiovascular:     Rate and Rhythm: Normal rate and regular rhythm.     Pulses: Intact distal pulses.     Heart sounds: Normal heart sounds. No murmur heard.   No gallop.  Pulmonary:     Effort: Pulmonary effort is normal.     Breath sounds: Normal breath  sounds.  Abdominal:     General: Bowel sounds are normal.     Palpations: Abdomen is soft.  Musculoskeletal:        General: No swelling.     Laboratory examination:   External labs:   NONFASTING labs serum glucose 148 mg, BUN 9, creatinine 0.79, EGFR 96 mL, potassium 4.0.  CMP otherwise normal.  Total cholesterol 183, triglycerides 282, HDL 50, LDL 89. Medications and allergies   Allergies  Allergen Reactions   Codeine      Medication prior to this encounter:   Outpatient Medications Prior to Visit  Medication Sig Dispense Refill   buPROPion (WELLBUTRIN XL) 150 MG 24 hr tablet Take 1 tablet by mouth daily.     amphetamine-dextroamphetamine (ADDERALL XR) 20 MG 24 hr capsule Take 20 mg by mouth 2 (two) times daily.     benzonatate (TESSALON) 100 MG capsule Take 1-2 capsules (100-200 mg total) by mouth 3 (three)  times daily as needed for cough. 40 capsule 0   pseudoephedrine (SUDAFED 12 HOUR) 120 MG 12 hr tablet Take 1 tablet (120 mg total) by mouth 2 (two) times daily. 30 tablet 3   No facility-administered medications prior to visit.     Medication list after today's encounter   Current Outpatient Medications  Medication Instructions   buPROPion (WELLBUTRIN XL) 150 MG 24 hr tablet 1 tablet, Oral, Daily    Radiology:   No results found.  Cardiac Studies:   Zio Patch Extended out patient EKG monitoring 7 days starting 01/26/2021:  Predominant rhythm is normal sinus rhythm.  Therefore brief atrial tachycardia episodes, longest 6 beats. Occasional PVCs, occasional ventricular couplets, and occasional ventricular bigeminy and trigeminy was present. Symptomatic transmissions correlated with ventricular trigeminy, PVCs and supraventricular ectopics.  There was no atrial fibrillation, no heart block. EKG:   EKG 03/09/2021: Sinus tachycardia at rate of 110 bpm, normal axis.  No evidence of ischemia, otherwise normal EKG.    Assessment     ICD-10-CM   1. Atypical chest pain  R07.89 EKG 12-Lead    PCV ECHOCARDIOGRAM COMPLETE    PCV CARDIAC STRESS TEST    CT CARDIAC SCORING ([facility] LOCATIONS ONLY)    2. Dyspnea on exertion  R06.00 PCV ECHOCARDIOGRAM COMPLETE    3. Palpitations  R00.2     4. 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 Elevated BP without diagnosis of hypertension  R03.0        Medications Discontinued During This Encounter  Medication Reason   amphetamine-dextroamphetamine (ADDERALL XR) 20 MG 24 hr capsule Error   benzonatate (TESSALON) 100 MG capsule Error   pseudoephedrine (SUDAFED 12 HOUR) 120 MG 12 hr tablet Error    No orders of the defined types were placed in this encounter.  Orders Placed This Encounter  Procedures   CT CARDIAC SCORING ([facility] LOCATIONS ONLY)    Standing Status:   Future    Standing Expiration Date:   05/09/2021    Order Specific Question:   Is  patient pregnant?    Answer:   No    Order Specific Question:   Preferred imaging location?    Answer:   [facility]   PCV CARDIAC STRESS TEST    Standing Status:   Future    Standing Expiration Date:   05/09/2021   EKG 12-Lead   PCV ECHOCARDIOGRAM COMPLETE    Standing Status:   Future    Standing Expiration Date:   03/09/2022   Recommendations:   Haylie  AALYIAH CAMBEROS is a 40 y.o. Caucasian female patient referred to me for evaluation of palpitations and chest pain.  She has chronic palpitations starting in her teenage years in the form of rapid heartbeats lasting a few seconds but recently has been having frequent episodes of flip-flops and skipped beats.  Over the past 1 to 2 months she has also noticed chest tightness with exertion activity and sometimes at rest that last for 1 to 2 minutes but sometimes is exacerbated by exercise and can last longer.  She has also noticed dyspnea on exertion.  Also admits to having gained weight over the past few months after she gave up on a diet plan that she was on.  Her symptoms of palpitations are clearly related to PVCs that give her is Feeling and rapid heartbeats lasting few seconds unrelated to brief episodes of atrial tachycardia.  The transmissions on Zio patch was personally reviewed.  I will also perform an echocardiogram to evaluate her dyspnea.  I suspect obesity hypoventilation.  With regard to her chest pain symptoms, it could be related to GERD in view of morbid obesity.  But she does have family history of coronary disease in her father who is a diabetic at age 54 who had bypass surgery, given this I scheduled her for a routine treadmill exercise stress test and also coronary calcium score.  I reviewed her external labs, I do not see a TSH.  Her lipids specifically triglycerides are elevated probably related to her diet.  Weight loss was discussed extensively.  Her blood sugar was elevated although it was nonfasting blood sugar, her blood pressure  is also elevated for her age and I suspect she has classic metabolic syndrome with morbid obesity, and elevated triglycerides as well.    Adrian Prows, MD, [facility] 03/09/2021, 8:21 PM Office: [phone]

## 2021-03-17 ENCOUNTER — Other Ambulatory Visit: Payer: Self-pay

## 2021-03-17 ENCOUNTER — Ambulatory Visit
Admission: RE | Admit: 2021-03-17 | Discharge: 2021-03-17 | Disposition: A | Discharge status: Home / Self Care | Payer: BC Managed Care – PPO | Source: Ambulatory Visit | Attending: Obstetrics and Gynecology | Admitting: Obstetrics and Gynecology

## 2021-03-17 DIAGNOSIS — Z1231 Encounter for screening mammogram for malignant neoplasm of breast: Secondary | ICD-10-CM

## 2021-03-17 HISTORY — DX: Unspecified lump in unspecified breast: N63.0

## 2021-03-26 ENCOUNTER — Other Ambulatory Visit: Payer: BC Managed Care – PPO

## 2021-03-30 ENCOUNTER — Ambulatory Visit
Admission: RE | Admit: 2021-03-30 | Discharge: 2021-03-30 | Disposition: A | Discharge status: Home / Self Care | Payer: No Typology Code available for payment source | Source: Ambulatory Visit | Attending: Cardiology | Admitting: Cardiology

## 2021-03-30 DIAGNOSIS — R0789 Other chest pain: Secondary | ICD-10-CM

## 2021-03-30 NOTE — Progress Notes (Signed)
Coronary calcium score 03/30/2021: Normal ascending and descending thoracic aortic measurements.  No significant extracardiac abnormalities. LM 0 LAD 0.5 LCx 0 RCA 1.4. Total Agatston score 1.9. MESA database percentile: MESA percentile data is not available for patients under the age of 38. This score in a 40 year old Caucasian female would be at the 94th percentile.

## 2021-04-09 ENCOUNTER — Ambulatory Visit: Payer: BC Managed Care – PPO

## 2021-04-09 ENCOUNTER — Other Ambulatory Visit: Payer: Self-pay

## 2021-04-09 DIAGNOSIS — R0609 Other forms of dyspnea: Secondary | ICD-10-CM

## 2021-04-09 DIAGNOSIS — R0789 Other chest pain: Secondary | ICD-10-CM

## 2021-04-20 ENCOUNTER — Ambulatory Visit: Payer: BC Managed Care – PPO | Admitting: Cardiology

## 2021-05-22 ENCOUNTER — Ambulatory Visit: Payer: BC Managed Care – PPO | Admitting: Cardiology

## 2021-06-02 NOTE — Progress Notes (Signed)
Primary Physician/Referring:  Charlynn Court, NP  Patient ID: Joanne Fletcher, female    DOB: 08/18/1980, 40 y.o.   MRN: [id number]  Chief Complaint  Patient presents with   Palpitations   Chest Pain   Follow-up   Results   HPI:    Joanne Fletcher  is a 40 y.o. Caucasian female patient referred to me for evaluation of palpitations and chest pain.   Last office visit patient was advised palpitations are related to PVCs and dyspnea likely related to obesity hypoventilation.  However given patient's symptoms recommended echocardiogram, exercise stress test, and coronary calcium score.  Patient's total coronary calcium score of 1.9 which would place her in the 94th percentile if she were 40 years old.  Stress test showed poor functional capacity and hypertensive response to exercise, but overall was relatively low risk.  Echocardiogram noted normal LVEF without abnormalities.  Patient now presents for follow-up.  Patient reports since last office visit chest pain and palpitations, as well as dyspnea have improved.  However she does continue to have occasional episodes of chest pain, particularly while in bed at night.  Denies orthopnea, PND, leg edema.  She admits to inactivity, and has notably had a 50 pound weight gain in the last 6 months.  Past Medical History:  Diagnosis Date   ADD (attention deficit disorder)    Breast mass    Past Surgical History:  Procedure Laterality Date   partial discectomy     REPAIR ANKLE LIGAMENT     Family History  Problem Relation Age of Onset   Depression Mother    Osteoarthritis Mother    Fibromyalgia Mother    Heart attack Father 57       2012   Diabetes Father    Heart disease Father    Diabetes Sister    Hyperlipidemia Sister    Heart attack Paternal Grandfather     Social History   Tobacco Use   Smoking status: Never   Smokeless tobacco: Never  Substance Use Topics   Alcohol use: Yes    Comment: occasionally   Marital Status:  Married  ROS  Review of Systems  Constitutional: Positive for weight gain.  Cardiovascular:  Positive for chest pain (improved), dyspnea on exertion and palpitations (improved). Negative for paroxysmal nocturnal dyspnea and syncope.  Gastrointestinal: Negative.   Objective  Blood pressure 139/82, pulse (!) 106, temperature 98.3 F (36.8 C), temperature source Temporal, height 5' 6"  (1.676 m), weight 278 lb (126.1 kg), SpO2 100 %. Body mass index is 44.87 kg/m.  Vitals with BMI 06/03/2021 03/09/2021 06/01/2015  Height 5' 6"  5' 6"  5' 6"   Weight 278 lbs 273 lbs 10 oz 247 lbs  BMI 75.64 33.29 40  Systolic 518 841 660  Diastolic 82 89 64  Pulse 630 111 85     Physical Exam Constitutional:      Appearance: She is morbidly obese.  Neck:     Vascular: No carotid bruit or JVD.  Cardiovascular:     Rate and Rhythm: Normal rate and regular rhythm.     Pulses: Intact distal pulses.     Heart sounds: Normal heart sounds. No murmur heard.   No gallop.  Pulmonary:     Effort: Pulmonary effort is normal.     Breath sounds: Normal breath sounds.  Abdominal:     General: Bowel sounds are normal.     Palpations: Abdomen is soft.  Musculoskeletal:        General:  No swelling.  Physical exam unchanged compared to previous office visit.  Laboratory examination:   External labs:   NONFASTING labs serum glucose 148 mg, BUN 9, creatinine 0.79, EGFR 96 mL, potassium 4.0.  CMP otherwise normal.  Total cholesterol 183, triglycerides 282, HDL 50, LDL 89.  Allergies   Allergies  Allergen Reactions   Codeine     Medication prior to this encounter:   Outpatient Medications Prior to Visit  Medication Sig Dispense Refill   buPROPion (WELLBUTRIN XL) 150 MG 24 hr tablet Take 1 tablet by mouth daily.     No facility-administered medications prior to visit.     Medication list after today's encounter   Current Outpatient Medications  Medication Instructions   atorvastatin (LIPITOR) 10 mg,  Oral, Daily    Radiology:   No results found.  Cardiac Studies:  PCV ECHOCARDIOGRAM COMPLETE 04/09/2021 1. Normal LV systolic function with visual EF 60-65%. Left ventricle cavity is normal in size. Normal left ventricular wall thickness. Normal global wall motion. Normal diastolic filling pattern, normal LAP. 2. No significant valvular heart disease. 3. No prior study for comparison   PCV CARDIAC STRESS TEST 04/09/2021 Functional status: Poor. Chest pain: No. Reason for stopping exercise: Fatigue/weakness. Hypertensive response to exercise: Yes. Exercise time 6 minutes 00 seconds on Bruce protocol, achieved 7.05 METS, 93% of age-predicted maximum heart rate. Stress ECG negative for ischemia. Low risk study, but clinical correlation is required (poor functional status & hypertensive response to exercise).  CORONARY CALCIUM SCORES 03/30/2021: Left Main: 0 LAD: 0.5 LCx: 0 RCA: 1.4 Total Agatston Score: 1.9 MESA database percentile: MESA percentile data is not available for patients under the age of 24. This score in a 40 year old Caucasian female would be at the 94th percentile.   AORTA MEASUREMENTS: Ascending Aorta: 28 mm Descending Aorta: 21 mm  IMPRESSION: 1. Coronary calcium score of 1.9. The patient is not old enough to place into the [facility]. A score of 18.50 in a 40 year old Caucasian female would be at the 94th percentile. 2. 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 3. Probable hepatic steatosis.  Zio Patch Extended out patient EKG monitoring 7 days starting 01/26/2021:  Predominant rhythm is normal sinus rhythm.  Therefore brief atrial tachycardia episodes, longest 6 beats. Occasional PVCs, occasional ventricular couplets, and occasional ventricular bigeminy and trigeminy was present. Symptomatic transmissions correlated with ventricular trigeminy, PVCs and supraventricular ectopics.  There was no atrial  fibrillation, no heart block.  EKG:   EKG 03/09/2021: Sinus tachycardia at rate of 110 bpm, normal axis.  No evidence of ischemia, otherwise normal EKG.    Assessment     ICD-10-CM   1. Atypical chest pain  R07.89     2. Dyspnea on exertion  R06.09     3. 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    Dispense:  90 tablet    Refill:  3    No orders of the defined types were placed in this encounter.  Recommendations:   Joanne Fletcher is a 40 y.o. Caucasian female patient referred to me for evaluation of palpitations and chest pain.   Last office visit patient was  advised palpitations are related to PVCs and dyspnea likely related to obesity hypoventilation.  However given patient's symptoms recommended echocardiogram, exercise stress test, and coronary calcium score.  Patient's total coronary calcium score of 1.9 which would place her in the 94th percentile if she were 40 years old.  Stress test showed poor functional capacity and hypertensive response to exercise, but overall was relatively low risk.  Echocardiogram noted normal LVEF without abnormalities.  Patient now presents for follow-up.  Reviewed and discussed results of echocardiogram, stress test, and coronary calcium score, details above.  Patient's questions were addressed to her satisfaction.  Suspect patient's symptoms are related to obesity and deconditioning given nonischemic EKG during stress testing and normal echocardiogram.  Discussed at length with patient regarding the importance of increasing physical activity and weight loss, she verbalized understanding and appears motivated.  Artery calcium score was elevated coronary calcium score elevated in the  94th percentile based on age and sex.  Given this shared decision was to start low-dose statin therapy with atorvastatin 10 mg p.o. nightly although her LDL is well controlled.  Blood pressure is well controlled.  Scheduled to establish with new PCP next month, given recent weight gain recommend having TSH checked, patient verbalized understanding agreement.  Patient is otherwise stable from a cardiovascular standpoint.  Follow-up in 1 year.   Alethia Berthold, PA-C 06/03/2021, 3:47 PM Office: [phone]

## 2021-06-03 ENCOUNTER — Encounter: Payer: Self-pay | Admitting: Student

## 2021-06-03 ENCOUNTER — Ambulatory Visit: Payer: BC Managed Care – PPO | Admitting: Student

## 2021-06-03 ENCOUNTER — Other Ambulatory Visit: Payer: Self-pay

## 2021-06-03 VITALS — BP 139/82 | HR 106 | Temp 98.3°F | Ht 66.0 in | Wt 278.0 lb

## 2021-06-03 DIAGNOSIS — R0609 Other forms of dyspnea: Secondary | ICD-10-CM

## 2021-06-03 DIAGNOSIS — R0789 Other chest pain: Secondary | ICD-10-CM

## 2021-06-03 DIAGNOSIS — I493 Ventricular premature depolarization: Secondary | ICD-10-CM

## 2021-06-03 MED ORDER — ATORVASTATIN CALCIUM 10 MG PO TABS: 10.0000 mg | ORAL_TABLET | Freq: Every day | ORAL | 3 refills | Status: AC

## 2022-03-05 IMAGING — CT CT CARDIAC CORONARY ARTERY CALCIUM SCORE
3 series · 14 of 20 positions shown, 16 images · non-contrast
Comparison: Prior breast imaging and biopsy in 5650.

CLINICAL DATA: 40-year-old Caucasian female with history of
hyperlipidemia and family history of heart disease.

EXAM:
CT CARDIAC CORONARY ARTERY CALCIUM SCORE
TECHNIQUE: Non-contrast imaging through the heart was performed using
prospective ECG gating. Image post processing was performed on an
independent workstation, allowing for quantitative analysis of the
heart and coronary arteries. Note that this exam targets the heart
and the chest was not imaged in its entirety.

[Series 2: calcium scoring 2.00 qr36 bestdiast 69% hrt calciu · axial · 0.45mm/px · z∈[+1804,+1888]mm · 4 of 70 slices shown]
[im 14/70  vessel]
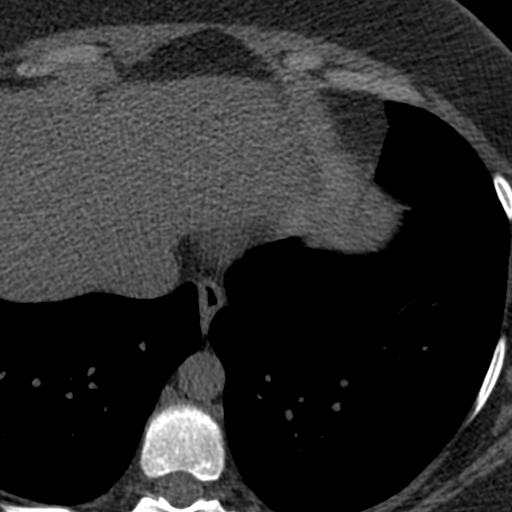
[im 28/70  vessel]
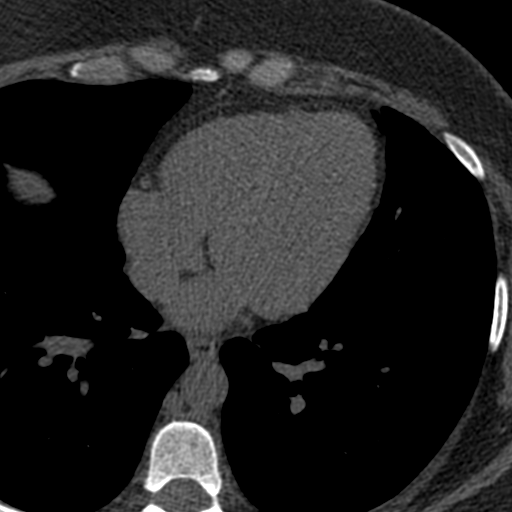
[im 42/70  vessel]
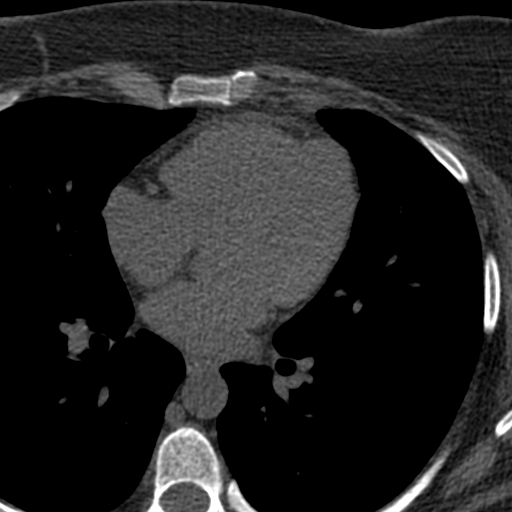
[im 56/70  vessel]
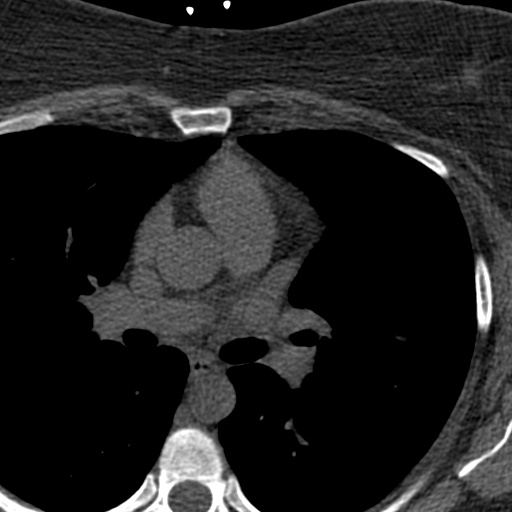

[Series 3: calcium scoring 2.00 br40 bestdiast 69% axial · axial · 0.61mm/px · z∈[+1800,+1892]mm · 5 of 70 slices shown, 7 images]
[im 12/70  vessel]
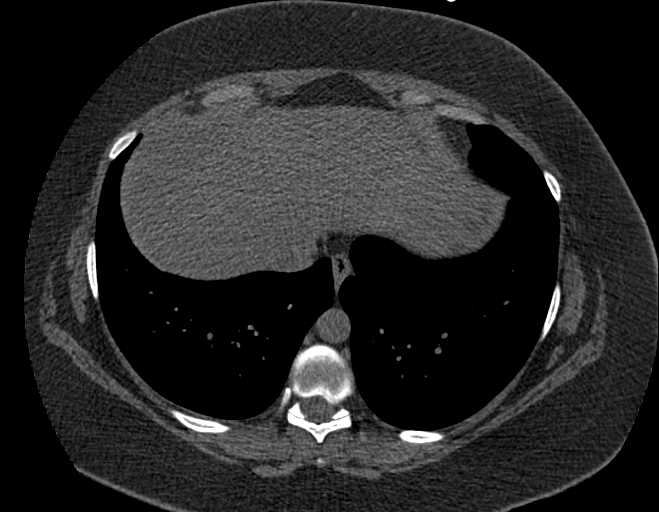
[im 12/70  lung]
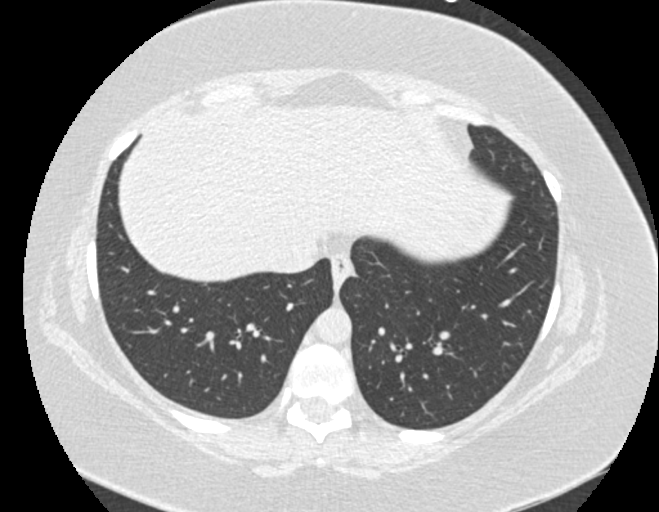
[im 24/70  vessel]
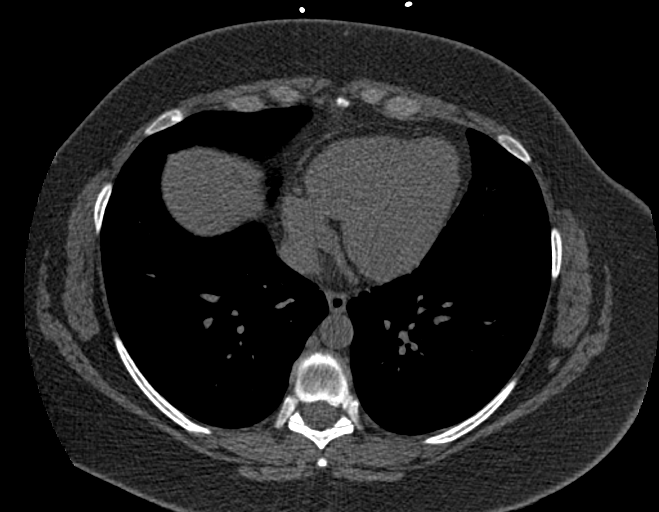
[im 35/70  vessel]
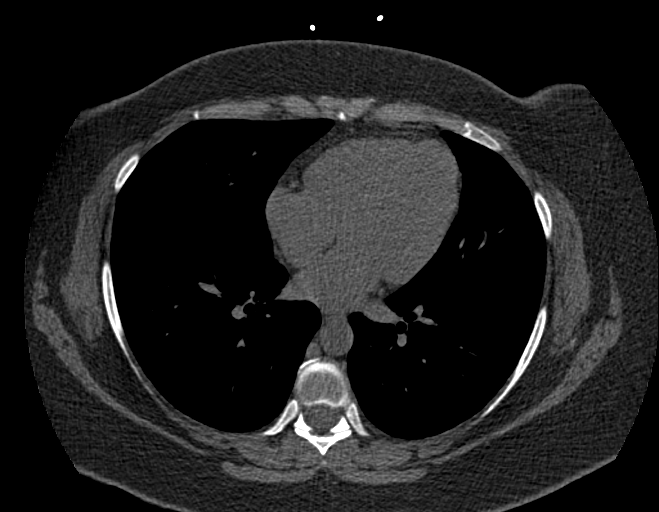
[im 47/70  vessel]
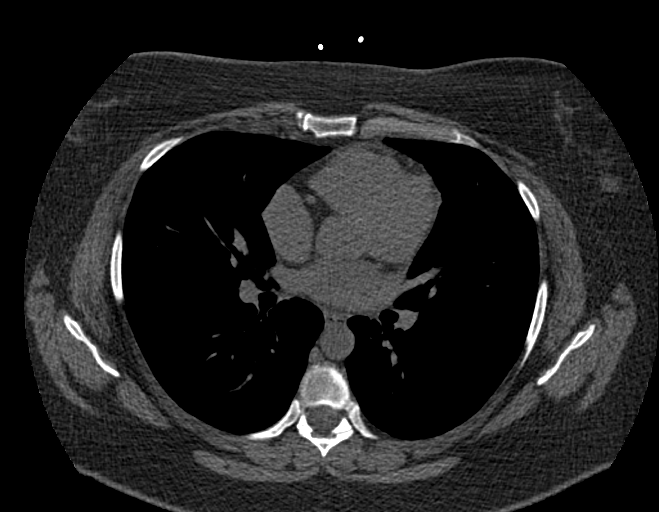
[im 58/70  vessel]
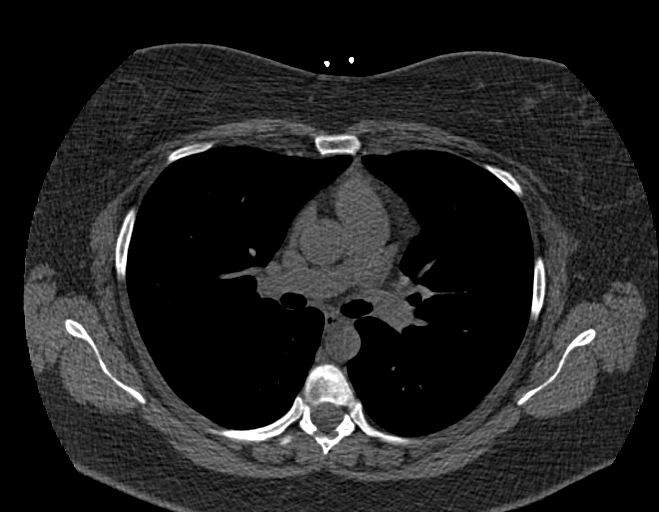
[im 58/70  lung]
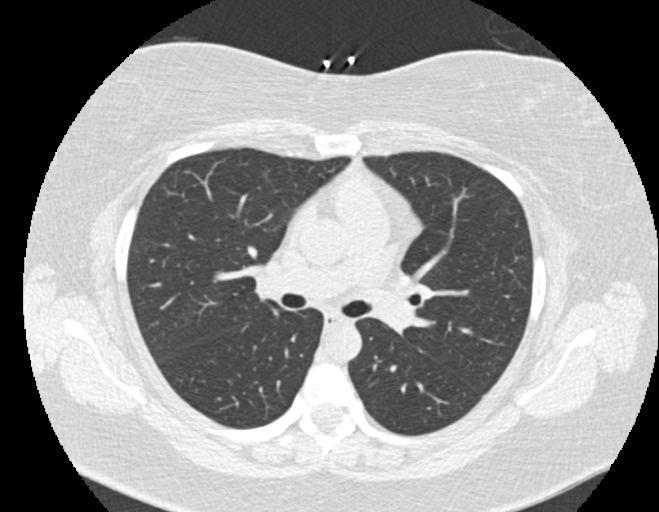

[Series 9: calcium scoring 2.00 br60 bestdiast 69% lungs · axial · 0.61mm/px · z∈[+1800,+1892]mm · 5 of 70 slices shown]
[im 12/70  vessel]
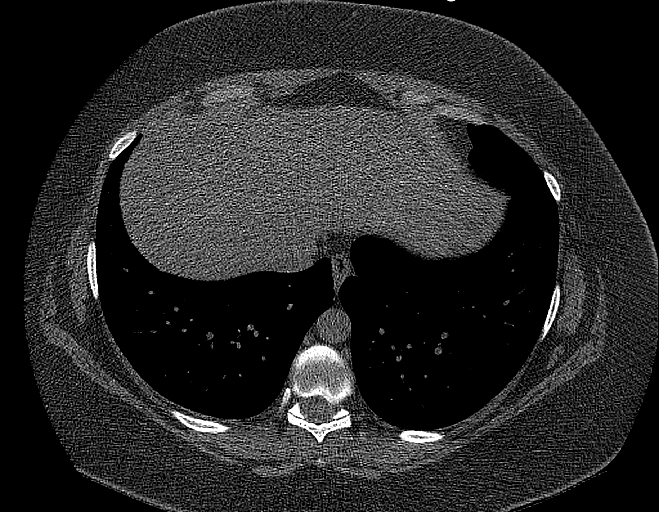
[im 24/70  vessel]
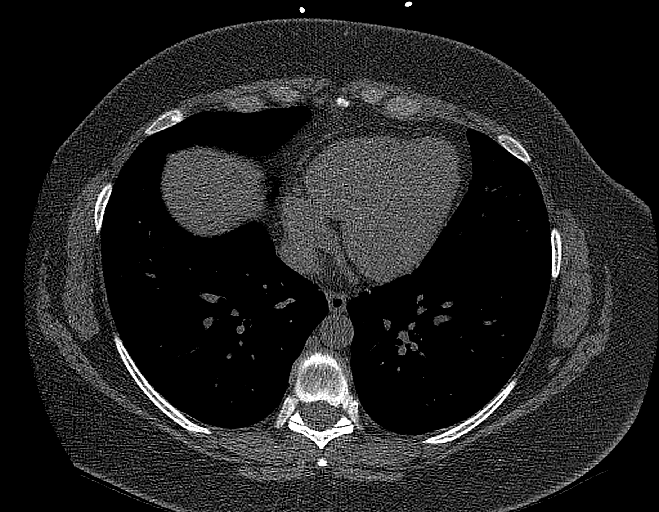
[im 35/70  vessel]
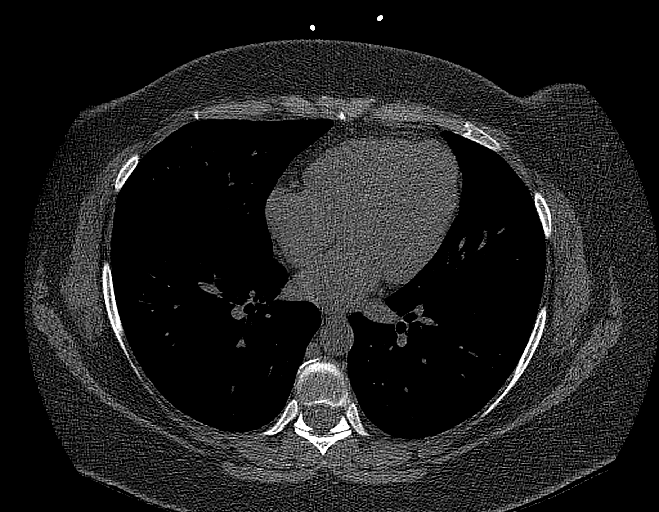
[im 47/70  vessel]
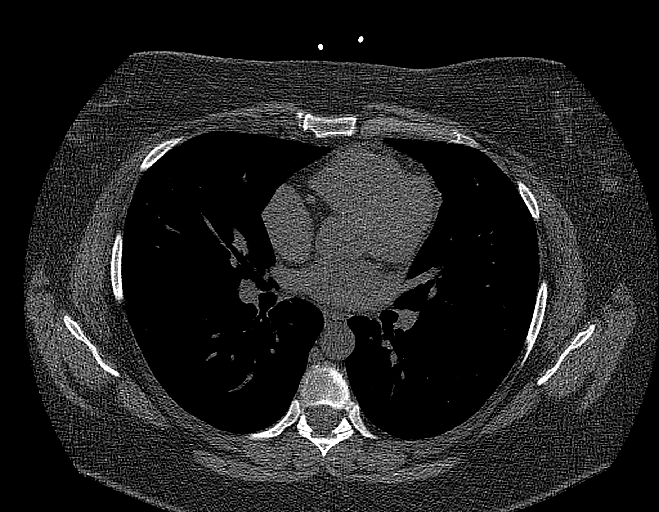
[im 58/70  vessel]
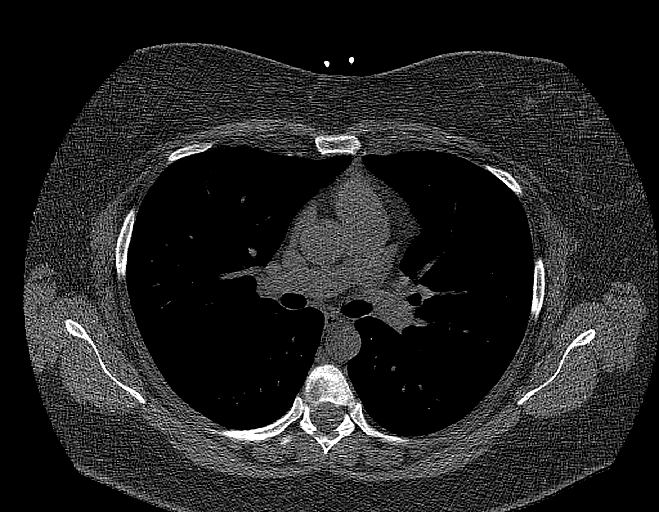

[14 of 20 positions shown; findings below may reference images not displayed]

FINDINGS: CORONARY CALCIUM SCORES:

Left Main: 0

LAD:

LCx: 0

RCA:

Total Agatston Score:

[HOSPITAL] percentile: MESA percentile data is not available for
patients under the age of 45. This score in a 44-year-old Caucasian
female would be at the 94th percentile.

AORTA MEASUREMENTS:

Ascending Aorta: 28 mm

Descending Aorta: 21 mm

OTHER FINDINGS:

The heart size is within normal limits. No pericardial fluid is
identified. Visualized segments of the thoracic aorta and central
pulmonary arteries are normal in caliber. Visualized mediastinum and
hilar regions demonstrate no lymphadenopathy or masses. Visualized
lungs show no evidence of pulmonary edema, consolidation,
pneumothorax, nodule or pleural fluid. Visualized bony structures
are unremarkable. There is likely steatosis of the visualized upper
liver. There is an ovoid mass in the upper-outer tissues of the left
breast measuring roughly 1.8 x 2.5 cm by axial CT images and
containing an internal clip from prior biopsy. Size by CT correlates
with prior workup with prior biopsy demonstrating benign
fibroadenoma.
IMPRESSION: 1. Coronary calcium score of 1.9. The patient is not old enough to
place into the [HOSPITAL]. A score of 1.9 in a 44-year-old
Caucasian male would be at the 94th percentile.
2. Well-circumscribed left breast mass which was previously biopsied
demonstrating benign fibroadenoma.
3. Probable hepatic steatosis.

## 2022-05-07 ENCOUNTER — Other Ambulatory Visit: Payer: Self-pay | Admitting: Obstetrics and Gynecology

## 2022-05-07 DIAGNOSIS — N6320 Unspecified lump in the left breast, unspecified quadrant: Secondary | ICD-10-CM

## 2022-05-27 ENCOUNTER — Ambulatory Visit
Admission: RE | Admit: 2022-05-27 | Discharge: 2022-05-27 | Disposition: A | Discharge status: Home / Self Care | Payer: BC Managed Care – PPO | Source: Ambulatory Visit | Attending: Obstetrics and Gynecology | Admitting: Obstetrics and Gynecology

## 2022-05-27 DIAGNOSIS — N6320 Unspecified lump in the left breast, unspecified quadrant: Secondary | ICD-10-CM

## 2022-06-03 ENCOUNTER — Ambulatory Visit: Payer: BC Managed Care – PPO | Admitting: Student

## 2023-04-26 ENCOUNTER — Encounter: Payer: Self-pay | Admitting: Obstetrics and Gynecology

## 2023-04-26 DIAGNOSIS — Z1231 Encounter for screening mammogram for malignant neoplasm of breast: Secondary | ICD-10-CM

## 2023-06-03 ENCOUNTER — Other Ambulatory Visit: Payer: Self-pay | Admitting: Obstetrics and Gynecology

## 2023-06-03 DIAGNOSIS — N6320 Unspecified lump in the left breast, unspecified quadrant: Secondary | ICD-10-CM

## 2023-07-06 ENCOUNTER — Ambulatory Visit
Admission: RE | Admit: 2023-07-06 | Discharge: 2023-07-06 | Disposition: A | Discharge status: Home / Self Care | Payer: BC Managed Care – PPO | Source: Ambulatory Visit | Attending: Obstetrics and Gynecology | Admitting: Obstetrics and Gynecology

## 2023-07-06 ENCOUNTER — Ambulatory Visit
Admission: RE | Admit: 2023-07-06 | Discharge: 2023-07-06 | Disposition: A | Discharge status: Home / Self Care | Payer: 59 | Source: Ambulatory Visit | Attending: Obstetrics and Gynecology | Admitting: Obstetrics and Gynecology

## 2023-07-06 ENCOUNTER — Other Ambulatory Visit: Payer: Self-pay | Admitting: Obstetrics and Gynecology

## 2023-07-06 DIAGNOSIS — N6320 Unspecified lump in the left breast, unspecified quadrant: Secondary | ICD-10-CM

## 2024-07-13 ENCOUNTER — Other Ambulatory Visit: Payer: Self-pay | Admitting: Obstetrics and Gynecology

## 2024-07-13 DIAGNOSIS — N6321 Unspecified lump in the left breast, upper outer quadrant: Secondary | ICD-10-CM

## 2024-07-24 ENCOUNTER — Other Ambulatory Visit: Payer: Self-pay | Admitting: Medical Genetics

## 2024-07-31 ENCOUNTER — Other Ambulatory Visit

## 2024-07-31 ENCOUNTER — Encounter
